# Patient Record
Sex: Female | Born: 2000 | Race: Black or African American | Hispanic: No | Smoking: Never smoker
Health system: Southern US, Community
[De-identification: ages and names within clinical notes are randomized; demographics above are authoritative.]

---

## 2021-10-24 ENCOUNTER — Ambulatory Visit: Payer: Self-pay | Attending: Internal Medicine

## 2021-10-24 DIAGNOSIS — Z23 Encounter for immunization: Secondary | ICD-10-CM

## 2021-10-24 NOTE — Progress Notes (Signed)
? ?  Covid-[facility] ? ?Name:  Tabitha Wiley    ?MRN: [id number] ?DOB: 2001/07/31 ? ?10/24/2021 ? ?[name] was observed post Covid-19 immunization for 15 minutes without incident. She was provided with Vaccine Information Sheet and instruction to access the V-Safe system.  ? ?[name] was instructed to call 911 with any severe reactions post vaccine: ?Difficulty breathing  ?Swelling of face and throat  ?A fast heartbeat  ?A bad rash all over body  ?Dizziness and weakness  ? ?Immunizations Administered   ? ? Name Date Dose VIS Date Route  ? PFIZER Comrnaty(Gray TOP) Covid-19 Vaccine 10/24/2021  1:20 PM 0.3 mL 04/20/2021 Intramuscular  ? Manufacturer: [place]: [employer]  ? NDC: 0069-2025-10  ? ?  ? ?

## 2021-10-25 ENCOUNTER — Other Ambulatory Visit (HOSPITAL_BASED_OUTPATIENT_CLINIC_OR_DEPARTMENT_OTHER): Payer: Self-pay

## 2021-10-25 MED ORDER — COVID-19 MRNA VACCINE (PFIZER) 30 MCG/0.3ML IM SUSP
INTRAMUSCULAR | 0 refills | Status: AC
  Filled 2021-10-25: qty 0.3, 1d supply, fill #0

## 2022-01-07 ENCOUNTER — Other Ambulatory Visit: Payer: Self-pay

## 2022-01-07 ENCOUNTER — Encounter (HOSPITAL_COMMUNITY): Payer: Self-pay | Admitting: *Deleted

## 2022-01-07 ENCOUNTER — Emergency Department (HOSPITAL_COMMUNITY)
Admission: EM | Admit: 2022-01-07 | Discharge: 2022-01-07 | Disposition: A | Discharge status: Home / Self Care | Payer: Self-pay | Attending: Emergency Medicine | Admitting: Emergency Medicine

## 2022-01-07 ENCOUNTER — Emergency Department (HOSPITAL_COMMUNITY): Payer: Self-pay

## 2022-01-07 DIAGNOSIS — N83209 Unspecified ovarian cyst, unspecified side: Secondary | ICD-10-CM | POA: Insufficient documentation

## 2022-01-07 DIAGNOSIS — B9689 Other specified bacterial agents as the cause of diseases classified elsewhere: Secondary | ICD-10-CM | POA: Insufficient documentation

## 2022-01-07 DIAGNOSIS — N760 Acute vaginitis: Secondary | ICD-10-CM | POA: Insufficient documentation

## 2022-01-07 LAB — CBC
HCT: 39.3 % (ref 36.0–46.0)
Hemoglobin: 12.6 g/dL (ref 12.0–15.0)
MCH: 27 pg (ref 26.0–34.0)
MCHC: 32.1 g/dL (ref 30.0–36.0)
MCV: 84.2 fL (ref 80.0–100.0)
Platelets: 278 10*3/uL (ref 150–400)
RBC: 4.67 MIL/uL (ref 3.87–5.11)
RDW: 12.9 % (ref 11.5–15.5)
WBC: 3.7 10*3/uL — ABNORMAL LOW (ref 4.0–10.5)
nRBC: 0 % (ref 0.0–0.2)

## 2022-01-07 LAB — WET PREP, GENITAL
Sperm: NONE SEEN
Trich, Wet Prep: NONE SEEN
WBC, Wet Prep HPF POC: >=10 — AB (ref <10)
Yeast Wet Prep HPF POC: NONE SEEN

## 2022-01-07 LAB — HIV ANTIBODY (ROUTINE TESTING W REFLEX): HIV Screen 4th Generation wRfx: NONREACTIVE

## 2022-01-07 LAB — COMPREHENSIVE METABOLIC PANEL
ALT: 16 U/L (ref 0–44)
AST: 18 U/L (ref 15–41)
Albumin: 4.1 g/dL (ref 3.5–5.0)
Alkaline Phosphatase: 47 U/L (ref 38–126)
Anion gap: 8 (ref 5–15)
BUN: 9 mg/dL (ref 6–20)
CO2: 23 mmol/L (ref 22–32)
Calcium: 9 mg/dL (ref 8.9–10.3)
Chloride: 108 mmol/L (ref 98–111)
Creatinine, Ser: 0.71 mg/dL (ref 0.44–1.00)
GFR, Estimated: >60 mL/min (ref >60)
Glucose, Bld: 122 mg/dL — ABNORMAL HIGH (ref 70–99)
Potassium: 3.6 mmol/L (ref 3.5–5.1)
Sodium: 139 mmol/L (ref 135–145)
Total Bilirubin: 0.7 mg/dL (ref 0.3–1.2)
Total Protein: 7.7 g/dL (ref 6.5–8.1)

## 2022-01-07 LAB — I-STAT BETA HCG BLOOD, ED (MC, WL, AP ONLY): I-stat hCG, quantitative: <5 m[IU]/mL (ref <5)

## 2022-01-07 LAB — URINALYSIS, ROUTINE W REFLEX MICROSCOPIC
Bilirubin Urine: NEGATIVE
Glucose, UA: NEGATIVE mg/dL
Hgb urine dipstick: NEGATIVE
Ketones, ur: NEGATIVE mg/dL
Leukocytes,Ua: NEGATIVE
Nitrite: NEGATIVE
Protein, ur: NEGATIVE mg/dL
Specific Gravity, Urine: 1.028 (ref 1.005–1.030)
pH: 5 (ref 5.0–8.0)

## 2022-01-07 LAB — LIPASE, BLOOD: Lipase: 40 U/L (ref 11–51)

## 2022-01-07 MED ORDER — KETOROLAC TROMETHAMINE 15 MG/ML IJ SOLN
15.0000 mg | Freq: Once | INTRAMUSCULAR | Status: AC
  Administered 2022-01-07: 15 mg via INTRAVENOUS
  Filled 2022-01-07: qty 1

## 2022-01-07 MED ORDER — ONDANSETRON HCL 4 MG/2ML IJ SOLN
4.0000 mg | Freq: Once | INTRAMUSCULAR | Status: AC
  Administered 2022-01-07: 4 mg via INTRAVENOUS
  Filled 2022-01-07: qty 2

## 2022-01-07 MED ORDER — OXYCODONE-ACETAMINOPHEN 5-325 MG PO TABS
1.0000 | ORAL_TABLET | Freq: Three times a day (TID) | ORAL | 0 refills | Status: AC | PRN
Start: 2022-01-07 — End: ?

## 2022-01-07 MED ORDER — FENTANYL CITRATE PF 50 MCG/ML IJ SOSY
50.0000 ug | PREFILLED_SYRINGE | Freq: Once | INTRAMUSCULAR | Status: AC
  Administered 2022-01-07: 50 ug via INTRAVENOUS
  Filled 2022-01-07: qty 1

## 2022-01-07 MED ORDER — METRONIDAZOLE 500 MG PO TABS: 500.0000 mg | ORAL_TABLET | Freq: Two times a day (BID) | ORAL | 0 refills | Status: AC

## 2022-01-07 NOTE — ED Provider Notes (Signed)
[facility]-EMERGENCY DEPT Provider Note   CSN: [id number] Arrival date & time: 01/07/22  0901     History  Chief Complaint  Patient presents with   Abdominal Pain    Tabitha Wiley is a 21 y.o. female.   Abdominal Pain Patient presents with rather acute onset of lower abdominal/suprapubic pain/pelvic pain this morning.  Somewhat crampy.  Has been constant but intensity will vary somewhat.  No dysuria.  No vaginal bleeding or discharge.  Denies pregnancy last menses was around 2 weeks ago.  No diarrhea constipation.  Somewhat decreased appetite.  No fevers.  Has been feeling well the last couple days.  She is otherwise healthy    Home Medications Prior to Admission medications   Medication Sig Start Date End Date Taking? Authorizing Provider  ibuprofen (ADVIL) 200 MG tablet Take 200 mg by mouth every 6 (six) hours as needed for mild pain.   Yes [provider]  metroNIDAZOLE (FLAGYL) 500 MG tablet Take 1 tablet (500 mg total) by mouth 2 (two) times daily. 01/07/22  Yes Benjiman Core, MD  oxyCODONE-acetaminophen (PERCOCET/ROXICET) 5-325 MG tablet Take 1-2 tablets by mouth every 8 (eight) hours as needed for severe pain. 01/07/22  Yes Benjiman Core, MD  COVID-19 mRNA vaccine, Pfizer, 30 MCG/0.3ML injection Inject into the muscle. Patient not taking: Reported on 01/07/2022 10/24/21   Judyann Munson, MD      Allergies    Patient has no known allergies.    Review of Systems   Review of Systems  Gastrointestinal:  Positive for abdominal pain.  Genitourinary:  Positive for flank pain and pelvic pain.   Physical Exam Updated Vital Signs BP 117/70   Pulse 71   Temp 98.7 F (37.1 C) (Oral)   Resp 16   Ht  (1.727 m)   Wt 65.8 kg   LMP 12/19/2021   SpO2 98%   BMI 22.05 kg/m  Physical Exam Vitals and nursing note reviewed.  HENT:     Head: Atraumatic.  Cardiovascular:     Rate and Rhythm: Regular rhythm.  Abdominal:     Tenderness:  There is abdominal tenderness.     Hernia: No hernia is present.     Comments: Suprapubic tenderness without rebound or guarding.  No hernia palpated.   Skin:    General: Skin is warm.     Capillary Refill: Capillary refill takes less than 2 seconds.  Neurological:     Mental Status: She is alert.    ED Results / Procedures / Treatments   Labs (all labs ordered are listed, but only abnormal results are displayed) Labs Reviewed  WET PREP, GENITAL - Abnormal; Notable for the following components:      Result Value   Clue Cells Wet Prep HPF POC PRESENT (*)    WBC, Wet Prep HPF POC >=10 (*)    All other components within normal limits  COMPREHENSIVE METABOLIC PANEL - Abnormal; Notable for the following components:   Glucose, Bld 122 (*)    All other components within normal limits  CBC - Abnormal; Notable for the following components:   WBC 3.7 (*)    All other components within normal limits  URINALYSIS, ROUTINE W REFLEX MICROSCOPIC - Abnormal; Notable for the following components:   APPearance HAZY (*)    All other components within normal limits  LIPASE, BLOOD  RPR  HIV ANTIBODY (ROUTINE TESTING W REFLEX)  I-STAT BETA HCG BLOOD, ED ([facility], [facility], [facility] ONLY)  GC/CHLAMYDIA PROBE AMP (  [place]) NOT AT [facility]    EKG None  Radiology US Transvaginal Non-OB  Result Date: 01/07/2022 CLINICAL DATA:  Pelvic pain EXAM: TRANSABDOMINAL AND TRANSVAGINAL ULTRASOUND OF PELVIS DOPPLER ULTRASOUND OF OVARIES TECHNIQUE: Both transabdominal and transvaginal ultrasound examinations of the pelvis were performed. Transabdominal technique was performed for global imaging of the pelvis including uterus, ovaries, adnexal regions, and pelvic cul-de-sac. It was necessary to proceed with endovaginal exam following the transabdominal exam to visualize the endometrium and ovaries. Color and duplex Doppler ultrasound was utilized to evaluate blood flow to the ovaries. COMPARISON:  None Available. FINDINGS: Uterus  Measurements: 7.7 x 4.8 x 5.9 cm = volume: 111.6 mL. There is 2.3 x 2.1 cm mixed echogenic focus in the right side of fundus of the uterus suggesting fibroid. Endometrium Thickness: 15 mm. Prominence of endometrial stripe may be due to secretory phase of menstrual cycle. There is no abnormal increased vascularity in the endometrium. Right ovary Measurements: 6 x 4.2 x 6.4 cm = volume: 84.4 mL. There is 4.5 x 2.8 x 4.8 cm complex cystic structure with thin internal septations in the right ovary, possibly hemorrhagic functional cyst. Left ovary Measurements: 2.2 x 1.8 x 1.9 cm = volume: 3.9 mL. Normal appearance/no adnexal mass. Pulsed Doppler evaluation of both ovaries demonstrates normal low-resistance arterial and venous waveforms. Other findings There is small amount of free fluid in the cul-de-sac. IMPRESSION: Small amount of free fluid in cul-de-sac in the pelvis may be due to recent rupture of ovarian cyst or follicle. There is 4.8 cm complex cystic structure in the right adnexa suggesting hemorrhagic functional cyst. Follow-up pelvic sonogram in 2-3 months may be considered to assess resolution. Prominence of endometrial stripe may be due to secretory phase of menstrual cycle. There is 2.3 cm mixed echogenic focus in the right side of the fundus of the uterus suggesting possible fibroid. Electronically Signed   By: Ernie Avena M.D.   On: 01/07/2022 12:25   US Pelvis Complete  Result Date: 01/07/2022 CLINICAL DATA:  Pelvic pain EXAM: TRANSABDOMINAL AND TRANSVAGINAL ULTRASOUND OF PELVIS DOPPLER ULTRASOUND OF OVARIES TECHNIQUE: Both transabdominal and transvaginal ultrasound examinations of the pelvis were performed. Transabdominal technique was performed for global imaging of the pelvis including uterus, ovaries, adnexal regions, and pelvic cul-de-sac. It was necessary to proceed with endovaginal exam following the transabdominal exam to visualize the endometrium and ovaries. Color and duplex Doppler  ultrasound was utilized to evaluate blood flow to the ovaries. COMPARISON:  None Available. FINDINGS: Uterus Measurements: 7.7 x 4.8 x 5.9 cm = volume: 111.6 mL. There is 2.3 x 2.1 cm mixed echogenic focus in the right side of fundus of the uterus suggesting fibroid. Endometrium Thickness: 15 mm. Prominence of endometrial stripe may be due to secretory phase of menstrual cycle. There is no abnormal increased vascularity in the endometrium. Right ovary Measurements: 6 x 4.2 x 6.4 cm = volume: 84.4 mL. There is 4.5 x 2.8 x 4.8 cm complex cystic structure with thin internal septations in the right ovary, possibly hemorrhagic functional cyst. Left ovary Measurements: 2.2 x 1.8 x 1.9 cm = volume: 3.9 mL. Normal appearance/no adnexal mass. Pulsed Doppler evaluation of both ovaries demonstrates normal low-resistance arterial and venous waveforms. Other findings There is small amount of free fluid in the cul-de-sac. IMPRESSION: Small amount of free fluid in cul-de-sac in the pelvis may be due to recent rupture of ovarian cyst or follicle. There is 4.8 cm complex cystic structure in the right adnexa suggesting hemorrhagic functional  cyst. Follow-up pelvic sonogram in 2-3 months may be considered to assess resolution. Prominence of endometrial stripe may be due to secretory phase of menstrual cycle. There is 2.3 cm mixed echogenic focus in the right side of the fundus of the uterus suggesting possible fibroid. Electronically Signed   By: Ernie Avena M.D.   On: 01/07/2022 12:25   [place] Art/Ven Flow Abd Pelv Doppler  Result Date: 01/07/2022 CLINICAL DATA:  Pelvic pain EXAM: TRANSABDOMINAL AND TRANSVAGINAL ULTRASOUND OF PELVIS DOPPLER ULTRASOUND OF OVARIES TECHNIQUE: Both transabdominal and transvaginal ultrasound examinations of the pelvis were performed. Transabdominal technique was performed for global imaging of the pelvis including uterus, ovaries, adnexal regions, and pelvic cul-de-sac. It was necessary to proceed  with endovaginal exam following the transabdominal exam to visualize the endometrium and ovaries. Color and duplex Doppler ultrasound was utilized to evaluate blood flow to the ovaries. COMPARISON:  None Available. FINDINGS: Uterus Measurements: 7.7 x 4.8 x 5.9 cm = volume: 111.6 mL. There is 2.3 x 2.1 cm mixed echogenic focus in the right side of fundus of the uterus suggesting fibroid. Endometrium Thickness: 15 mm. Prominence of endometrial stripe may be due to secretory phase of menstrual cycle. There is no abnormal increased vascularity in the endometrium. Right ovary Measurements: 6 x 4.2 x 6.4 cm = volume: 84.4 mL. There is 4.5 x 2.8 x 4.8 cm complex cystic structure with thin internal septations in the right ovary, possibly hemorrhagic functional cyst. Left ovary Measurements: 2.2 x 1.8 x 1.9 cm = volume: 3.9 mL. Normal appearance/no adnexal mass. Pulsed Doppler evaluation of both ovaries demonstrates normal low-resistance arterial and venous waveforms. Other findings There is small amount of free fluid in the cul-de-sac. IMPRESSION: Small amount of free fluid in cul-de-sac in the pelvis may be due to recent rupture of ovarian cyst or follicle. There is 4.8 cm complex cystic structure in the right adnexa suggesting hemorrhagic functional cyst. Follow-up pelvic sonogram in 2-3 months may be considered to assess resolution. Prominence of endometrial stripe may be due to secretory phase of menstrual cycle. There is 2.3 cm mixed echogenic focus in the right side of the fundus of the uterus suggesting possible fibroid. Electronically Signed   By: Ernie Avena M.D.   On: 01/07/2022 12:25    Procedures Procedures    Medications Ordered in ED Medications  fentaNYL (SUBLIMAZE) injection 50 mcg (50 mcg Intravenous Given 01/07/22 0944)  ondansetron (ZOFRAN) injection 4 mg (4 mg Intravenous Given 01/07/22 0940)  ketorolac (TORADOL) 15 MG/ML injection 15 mg (15 mg Intravenous Given 01/07/22 1312)    ED  Course/ Medical Decision Making/ A&P                           Medical Decision Making Amount and/or Complexity of Data Reviewed Labs: ordered. Radiology: ordered.  Risk Prescription drug management.   Patient with lower abdominal pain.  Acute onset.  Initially severe but improved after treatment.  Not having fevers.  Ultrasound done and showed potential cause with an ovarian cyst.  Pelvic exam showed a little bit of fluid discharge and will treat for bacterial vaginosis.  Appears stable for discharge home.        Final Clinical Impression(s) / ED Diagnoses Final diagnoses:  Cyst of ovary, unspecified laterality  Bacterial vaginosis    Rx / DC Orders ED Discharge Orders          Ordered    oxyCODONE-acetaminophen (PERCOCET/ROXICET) 5-325 MG tablet  Every 8 hours PRN        01/07/22 1317    metroNIDAZOLE (FLAGYL) 500 MG tablet  2 times daily        01/07/22 1318              Benjiman CorePickering, Reeder Brisby, MD 01/08/22 1531

## 2022-01-07 NOTE — Discharge Instructions (Addendum)
He will likely need another ultrasound in a few months to evaluate the cyst.

## 2022-01-07 NOTE — ED Triage Notes (Signed)
Low abd pain started this morning, No N/V/D with symptoms

## 2022-01-08 LAB — RPR: RPR Ser Ql: NONREACTIVE

## 2022-01-09 LAB — GC/CHLAMYDIA PROBE AMP (~~LOC~~) NOT AT ARMC
Chlamydia: NEGATIVE
Comment: NEGATIVE
Comment: NORMAL
Neisseria Gonorrhea: NEGATIVE

## 2023-06-13 IMAGING — US US ART/VEN ABD/PELV/SCROTUM DOPPLER LTD
1 series · 13 of 25 positions shown · non-contrast
Comparison: None Available.

CLINICAL DATA: Pelvic pain

EXAM:
TRANSABDOMINAL AND TRANSVAGINAL ULTRASOUND OF PELVIS
DOPPLER ULTRASOUND OF OVARIES
TECHNIQUE: Both transabdominal and transvaginal ultrasound examinations of the
pelvis were performed. Transabdominal technique was performed for
global imaging of the pelvis including uterus, ovaries, adnexal
regions, and pelvic cul-de-sac.
It was necessary to proceed with endovaginal exam following the
transabdominal exam to visualize the endometrium and ovaries. Color
and duplex Doppler ultrasound was utilized to evaluate blood flow to
the ovaries.

[Series 1: us pelvic doppler (torsion right/o or mass arteria · arterial · 150 acquisitions, 13 frames shown]
[im 1/150]
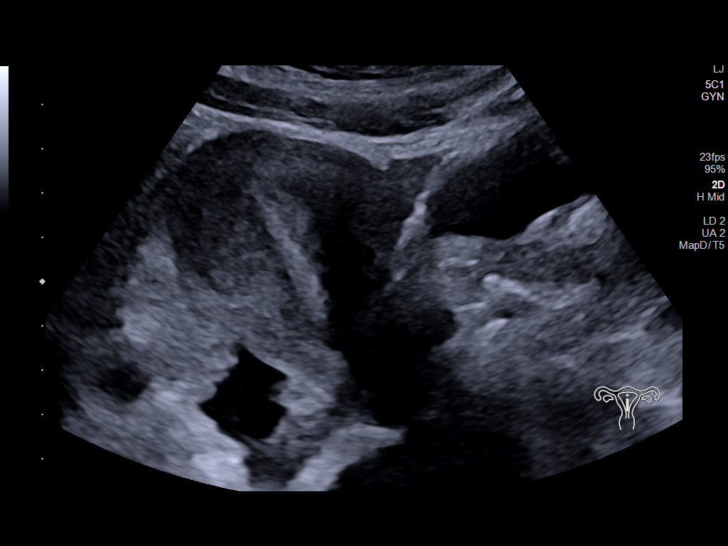
[im 13/150]
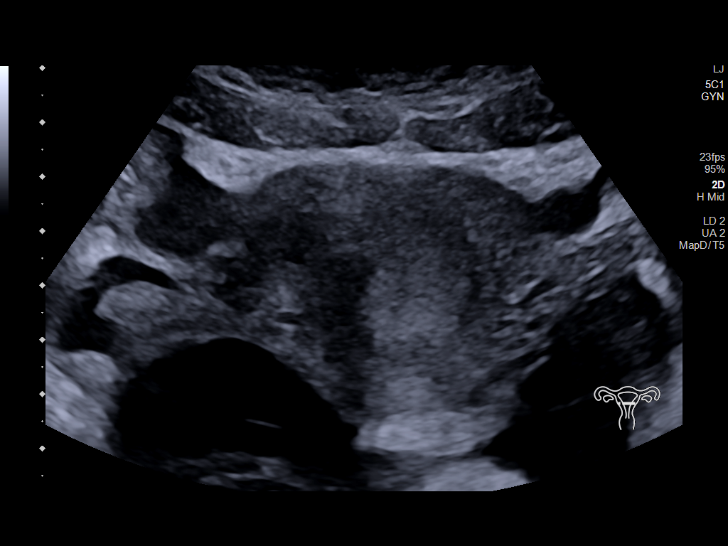
[im 25/150]
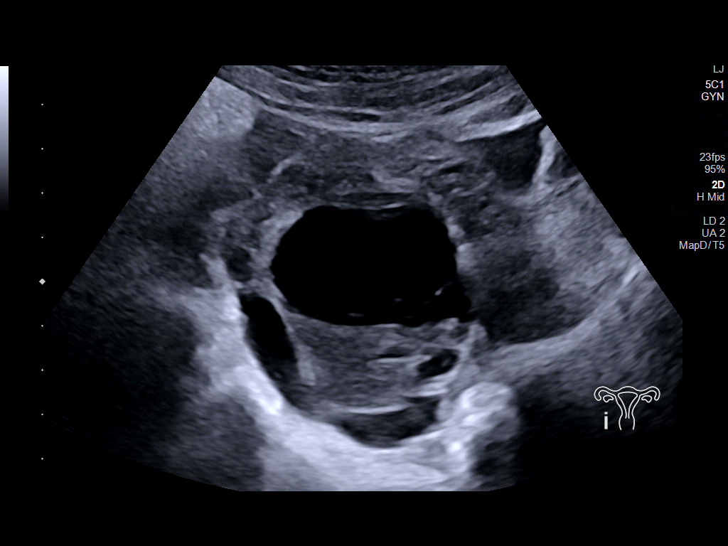
[im 38/150]
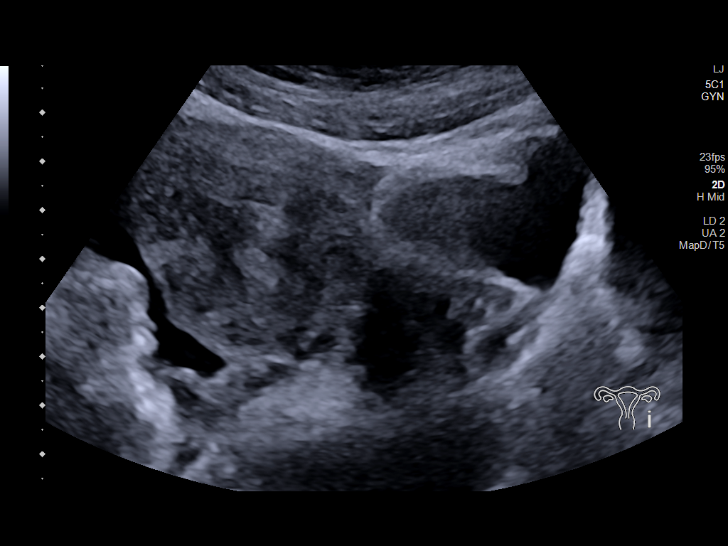
[im 50/150]
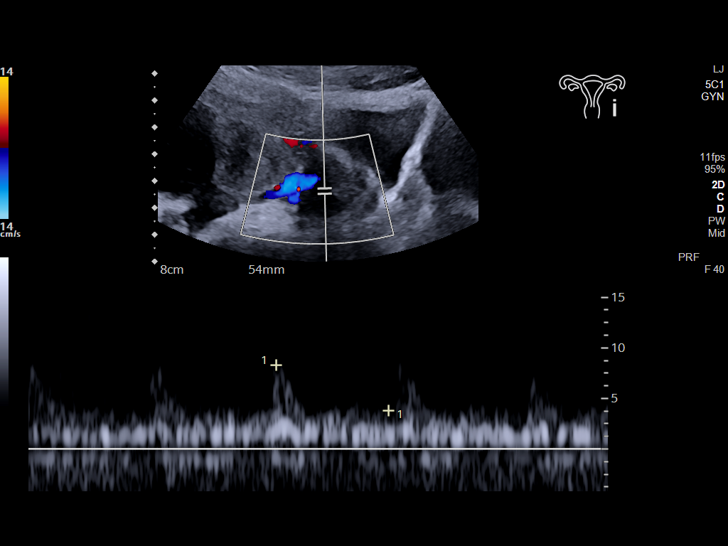
[im 63/150]
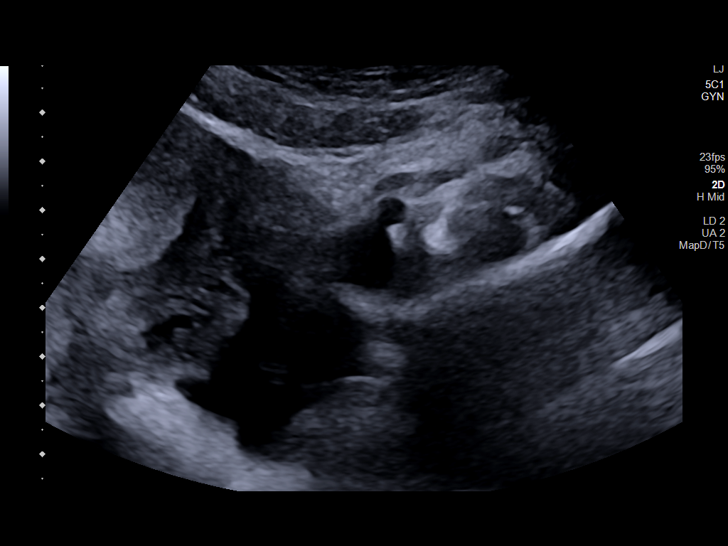
[im 75/150]
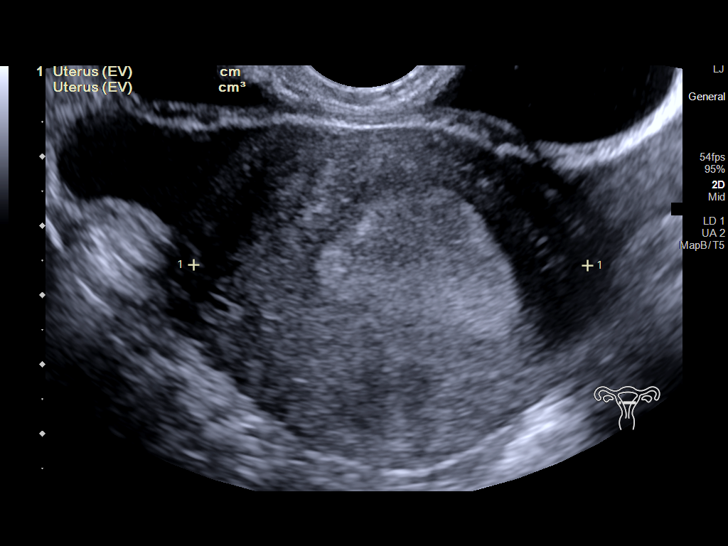
[im 87/150]
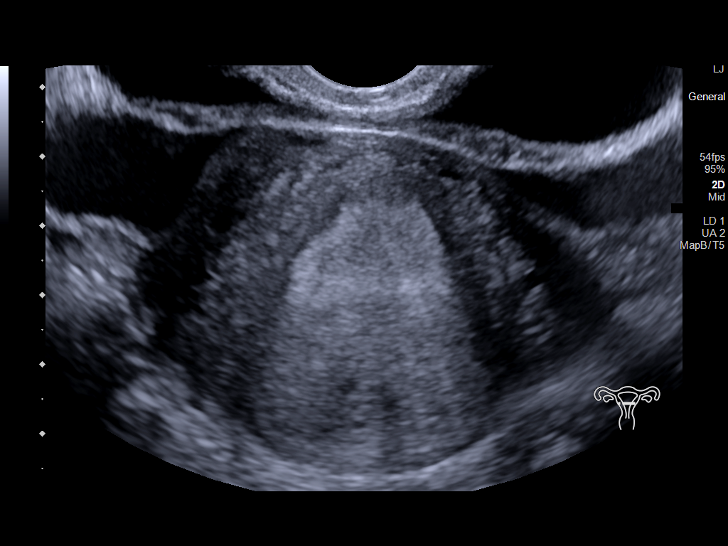
[im 100/150]
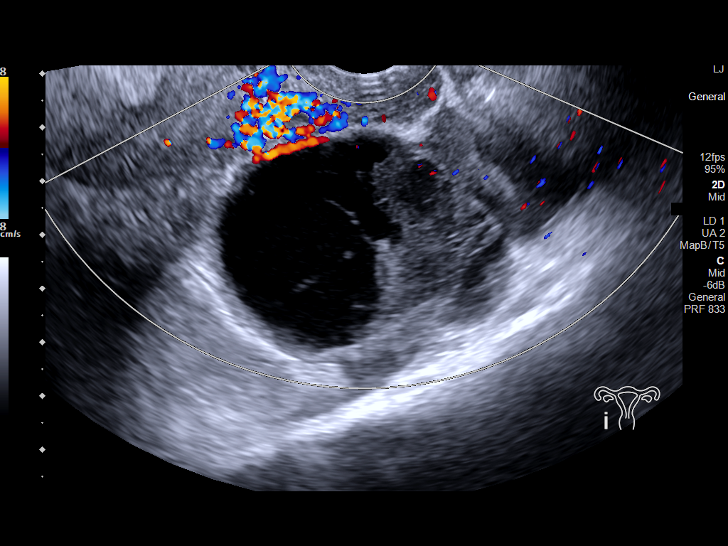
[im 112/150]
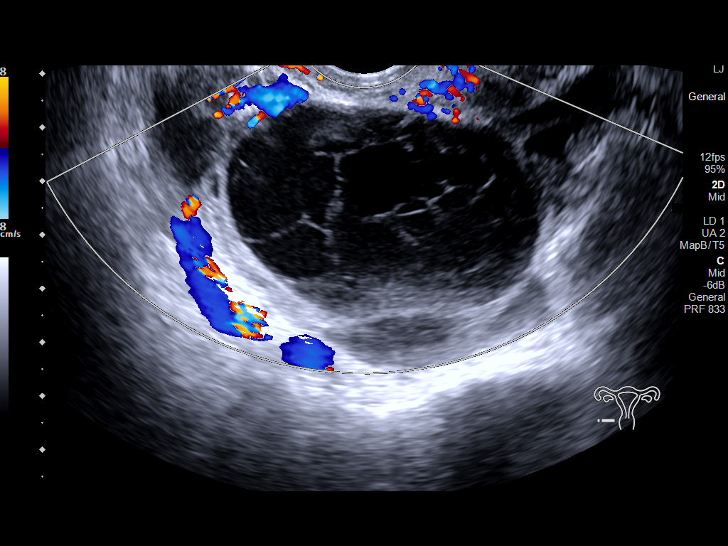
[im 125/150]
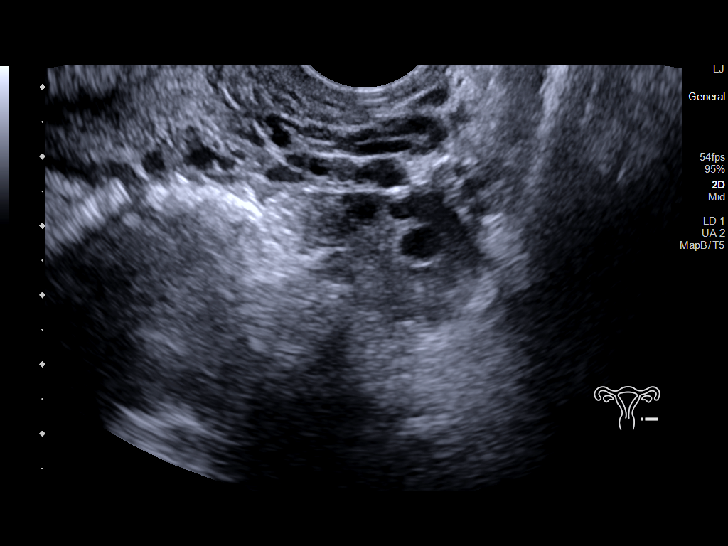
[im 137/150]
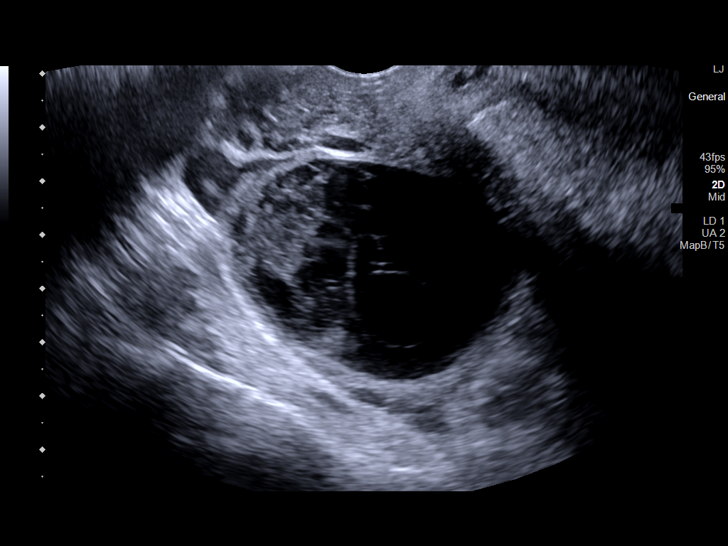
[im 150/150]
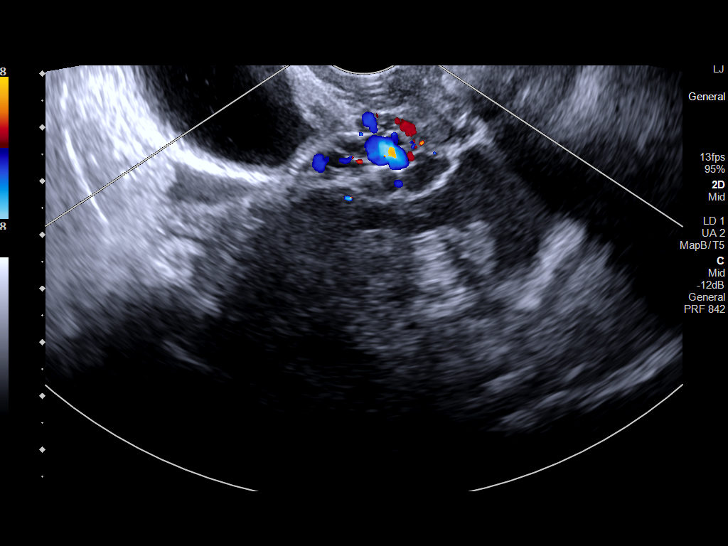

[13 of 25 positions shown; findings below may reference images not displayed]

FINDINGS: Uterus

Measurements: 7.7 x 4.8 x 5.9 cm = volume: 111.6 mL. There is 2.3 x
2.1 cm mixed echogenic focus in the right side of fundus of the
uterus suggesting fibroid.

Endometrium

Thickness: 15 mm. Prominence of endometrial stripe may be due to
secretory phase of menstrual cycle. There is no abnormal increased
vascularity in the endometrium.

Right ovary

Measurements: 6 x 4.2 x 6.4 cm = volume: 84.4 mL. There is 4.5 x
x 4.8 cm complex cystic structure with thin internal septations in
the right ovary, possibly hemorrhagic functional cyst.

Left ovary

Measurements: 2.2 x 1.8 x 1.9 cm = volume: 3.9 mL. Normal
appearance/no adnexal mass.

Pulsed Doppler evaluation of both ovaries demonstrates normal
low-resistance arterial and venous waveforms.

Other findings

There is small amount of free fluid in the cul-de-sac.
IMPRESSION: Small amount of free fluid in cul-de-sac in the pelvis may be due to
recent rupture of ovarian cyst or follicle.

There is 4.8 cm complex cystic structure in the right adnexa
suggesting hemorrhagic functional cyst. Follow-up pelvic sonogram in
2-3 months may be considered to assess resolution.

Prominence of endometrial stripe may be due to secretory phase of
menstrual cycle. There is 2.3 cm mixed echogenic focus in the right
side of the fundus of the uterus suggesting possible fibroid.
